# Patient Record
Sex: Female | Born: 1980 | Race: Black or African American | Hispanic: No | Marital: Single | Smoking: Former smoker
Health system: Southern US, Community
[De-identification: ages and names within clinical notes are randomized; demographics above are authoritative.]

## PROBLEM LIST (undated history)

## (undated) ENCOUNTER — Inpatient Hospital Stay (HOSPITAL_COMMUNITY): Payer: Self-pay

## (undated) DIAGNOSIS — Z789 Other specified health status: Secondary | ICD-10-CM

## (undated) HISTORY — PX: CHOLECYSTECTOMY: SHX55

## (undated) HISTORY — PX: WISDOM TOOTH EXTRACTION: SHX21

---

## 1998-04-07 ENCOUNTER — Emergency Department (HOSPITAL_COMMUNITY): Admission: EM | Admit: 1998-04-07 | Discharge: 1998-04-07 | Payer: Self-pay

## 1998-06-07 ENCOUNTER — Other Ambulatory Visit: Admission: RE | Admit: 1998-06-07 | Discharge: 1998-06-07 | Payer: Self-pay | Admitting: Obstetrics

## 1998-07-09 ENCOUNTER — Emergency Department (HOSPITAL_COMMUNITY): Admission: EM | Admit: 1998-07-09 | Discharge: 1998-07-09 | Payer: Self-pay | Admitting: Emergency Medicine

## 1998-08-21 ENCOUNTER — Inpatient Hospital Stay (HOSPITAL_COMMUNITY): Admission: AD | Admit: 1998-08-21 | Discharge: 1998-08-21 | Payer: Self-pay | Admitting: Obstetrics

## 1998-08-22 ENCOUNTER — Inpatient Hospital Stay (HOSPITAL_COMMUNITY): Admission: AD | Admit: 1998-08-22 | Discharge: 1998-08-22 | Payer: Self-pay | Admitting: Obstetrics

## 1998-08-29 ENCOUNTER — Ambulatory Visit (HOSPITAL_COMMUNITY): Admission: RE | Admit: 1998-08-29 | Discharge: 1998-08-29 | Payer: Self-pay | Admitting: Obstetrics

## 1998-09-15 ENCOUNTER — Inpatient Hospital Stay (HOSPITAL_COMMUNITY): Admission: AD | Admit: 1998-09-15 | Discharge: 1998-09-15 | Payer: Self-pay | Admitting: Obstetrics

## 1998-11-08 ENCOUNTER — Encounter: Payer: Self-pay | Admitting: Emergency Medicine

## 1998-11-08 ENCOUNTER — Emergency Department (HOSPITAL_COMMUNITY): Admission: EM | Admit: 1998-11-08 | Discharge: 1998-11-08 | Payer: Self-pay | Admitting: Emergency Medicine

## 1998-12-11 ENCOUNTER — Emergency Department (HOSPITAL_COMMUNITY): Admission: EM | Admit: 1998-12-11 | Discharge: 1998-12-11 | Payer: Self-pay | Admitting: Emergency Medicine

## 1999-02-21 ENCOUNTER — Emergency Department (HOSPITAL_COMMUNITY): Admission: EM | Admit: 1999-02-21 | Discharge: 1999-02-21 | Payer: Self-pay | Admitting: Emergency Medicine

## 1999-03-16 ENCOUNTER — Inpatient Hospital Stay (HOSPITAL_COMMUNITY): Admission: AD | Admit: 1999-03-16 | Discharge: 1999-03-16 | Payer: Self-pay | Admitting: Obstetrics

## 1999-07-08 ENCOUNTER — Inpatient Hospital Stay (HOSPITAL_COMMUNITY): Admission: AD | Admit: 1999-07-08 | Discharge: 1999-07-08 | Payer: Self-pay | Admitting: Obstetrics

## 1999-07-10 ENCOUNTER — Inpatient Hospital Stay (HOSPITAL_COMMUNITY): Admission: AD | Admit: 1999-07-10 | Discharge: 1999-07-10 | Payer: Self-pay | Admitting: Obstetrics

## 1999-07-22 ENCOUNTER — Inpatient Hospital Stay (HOSPITAL_COMMUNITY): Admission: AD | Admit: 1999-07-22 | Discharge: 1999-07-22 | Payer: Self-pay | Admitting: Obstetrics

## 1999-08-04 ENCOUNTER — Inpatient Hospital Stay (HOSPITAL_COMMUNITY): Admission: AD | Admit: 1999-08-04 | Discharge: 1999-08-04 | Payer: Self-pay | Admitting: Obstetrics

## 1999-08-31 ENCOUNTER — Inpatient Hospital Stay (HOSPITAL_COMMUNITY): Admission: AD | Admit: 1999-08-31 | Discharge: 1999-08-31 | Payer: Self-pay | Admitting: Obstetrics

## 1999-09-15 ENCOUNTER — Inpatient Hospital Stay (HOSPITAL_COMMUNITY): Admission: AD | Admit: 1999-09-15 | Discharge: 1999-09-15 | Payer: Self-pay | Admitting: Obstetrics

## 1999-09-20 ENCOUNTER — Inpatient Hospital Stay (HOSPITAL_COMMUNITY): Admission: AD | Admit: 1999-09-20 | Discharge: 1999-09-20 | Payer: Self-pay | Admitting: Obstetrics

## 1999-09-30 ENCOUNTER — Inpatient Hospital Stay (HOSPITAL_COMMUNITY): Admission: AD | Admit: 1999-09-30 | Discharge: 1999-09-30 | Payer: Self-pay | Admitting: Obstetrics

## 1999-10-01 ENCOUNTER — Inpatient Hospital Stay (HOSPITAL_COMMUNITY): Admission: AD | Admit: 1999-10-01 | Discharge: 1999-10-01 | Payer: Self-pay | Admitting: Obstetrics

## 1999-10-20 ENCOUNTER — Inpatient Hospital Stay (HOSPITAL_COMMUNITY): Admission: AD | Admit: 1999-10-20 | Discharge: 1999-10-20 | Payer: Self-pay | Admitting: Internal Medicine

## 1999-10-20 ENCOUNTER — Inpatient Hospital Stay (HOSPITAL_COMMUNITY): Admission: AD | Admit: 1999-10-20 | Discharge: 1999-10-20 | Payer: Self-pay | Admitting: Obstetrics

## 1999-10-21 ENCOUNTER — Inpatient Hospital Stay (HOSPITAL_COMMUNITY): Admission: AD | Admit: 1999-10-21 | Discharge: 1999-10-23 | Payer: Self-pay | Admitting: Obstetrics

## 2000-06-27 ENCOUNTER — Other Ambulatory Visit: Admission: RE | Admit: 2000-06-27 | Discharge: 2000-06-27 | Payer: Self-pay | Admitting: Obstetrics

## 2000-08-02 ENCOUNTER — Emergency Department (HOSPITAL_COMMUNITY): Admission: EM | Admit: 2000-08-02 | Discharge: 2000-08-02 | Payer: Self-pay | Admitting: Emergency Medicine

## 2000-09-11 ENCOUNTER — Inpatient Hospital Stay (HOSPITAL_COMMUNITY): Admission: AD | Admit: 2000-09-11 | Discharge: 2000-09-11 | Payer: Self-pay | Admitting: Obstetrics

## 2000-09-15 ENCOUNTER — Inpatient Hospital Stay (HOSPITAL_COMMUNITY): Admission: AD | Admit: 2000-09-15 | Discharge: 2000-09-15 | Payer: Self-pay | Admitting: Obstetrics

## 2000-09-27 ENCOUNTER — Inpatient Hospital Stay (HOSPITAL_COMMUNITY): Admission: AD | Admit: 2000-09-27 | Discharge: 2000-09-27 | Payer: Self-pay | Admitting: Obstetrics

## 2000-10-01 ENCOUNTER — Inpatient Hospital Stay (HOSPITAL_COMMUNITY): Admission: AD | Admit: 2000-10-01 | Discharge: 2000-10-01 | Payer: Self-pay | Admitting: Obstetrics

## 2000-10-09 ENCOUNTER — Inpatient Hospital Stay (HOSPITAL_COMMUNITY): Admission: AD | Admit: 2000-10-09 | Discharge: 2000-10-09 | Payer: Self-pay | Admitting: Obstetrics

## 2000-10-11 ENCOUNTER — Inpatient Hospital Stay (HOSPITAL_COMMUNITY): Admission: AD | Admit: 2000-10-11 | Discharge: 2000-10-14 | Payer: Self-pay | Admitting: Obstetrics

## 2002-11-30 ENCOUNTER — Emergency Department (HOSPITAL_COMMUNITY): Admission: EM | Admit: 2002-11-30 | Discharge: 2002-11-30 | Payer: Self-pay | Admitting: Emergency Medicine

## 2003-04-13 ENCOUNTER — Emergency Department (HOSPITAL_COMMUNITY): Admission: EM | Admit: 2003-04-13 | Discharge: 2003-04-13 | Payer: Self-pay | Admitting: Emergency Medicine

## 2004-03-03 ENCOUNTER — Emergency Department (HOSPITAL_COMMUNITY): Admission: EM | Admit: 2004-03-03 | Discharge: 2004-03-03 | Payer: Self-pay | Admitting: Emergency Medicine

## 2005-01-25 ENCOUNTER — Emergency Department (HOSPITAL_COMMUNITY): Admission: EM | Admit: 2005-01-25 | Discharge: 2005-01-25 | Payer: Self-pay | Admitting: Emergency Medicine

## 2005-02-20 ENCOUNTER — Inpatient Hospital Stay (HOSPITAL_COMMUNITY): Admission: AD | Admit: 2005-02-20 | Discharge: 2005-02-20 | Payer: Self-pay | Admitting: *Deleted

## 2005-03-23 ENCOUNTER — Inpatient Hospital Stay (HOSPITAL_COMMUNITY): Admission: AD | Admit: 2005-03-23 | Discharge: 2005-03-23 | Payer: Self-pay | Admitting: *Deleted

## 2006-05-08 ENCOUNTER — Inpatient Hospital Stay (HOSPITAL_COMMUNITY): Admission: AD | Admit: 2006-05-08 | Discharge: 2006-05-08 | Payer: Self-pay | Admitting: Obstetrics

## 2006-05-25 ENCOUNTER — Emergency Department (HOSPITAL_COMMUNITY): Admission: EM | Admit: 2006-05-25 | Discharge: 2006-05-25 | Payer: Self-pay | Admitting: Emergency Medicine

## 2006-11-23 ENCOUNTER — Emergency Department (HOSPITAL_COMMUNITY): Admission: EM | Admit: 2006-11-23 | Discharge: 2006-11-23 | Payer: Self-pay | Admitting: Emergency Medicine

## 2007-04-09 ENCOUNTER — Emergency Department (HOSPITAL_COMMUNITY): Admission: EM | Admit: 2007-04-09 | Discharge: 2007-04-09 | Payer: Self-pay | Admitting: Emergency Medicine

## 2007-11-08 ENCOUNTER — Emergency Department (HOSPITAL_COMMUNITY): Admission: EM | Admit: 2007-11-08 | Discharge: 2007-11-08 | Payer: Self-pay | Admitting: Emergency Medicine

## 2008-06-09 ENCOUNTER — Emergency Department (HOSPITAL_COMMUNITY): Admission: EM | Admit: 2008-06-09 | Discharge: 2008-06-09 | Payer: Self-pay | Admitting: Emergency Medicine

## 2008-08-18 ENCOUNTER — Emergency Department (HOSPITAL_COMMUNITY): Admission: EM | Admit: 2008-08-18 | Discharge: 2008-08-18 | Payer: Self-pay | Admitting: Emergency Medicine

## 2009-06-26 ENCOUNTER — Emergency Department (HOSPITAL_COMMUNITY): Admission: EM | Admit: 2009-06-26 | Discharge: 2009-06-26 | Payer: Self-pay | Admitting: Family Medicine

## 2009-07-27 ENCOUNTER — Inpatient Hospital Stay (HOSPITAL_COMMUNITY): Admission: AD | Admit: 2009-07-27 | Discharge: 2009-07-27 | Payer: Self-pay | Admitting: Obstetrics

## 2009-07-27 ENCOUNTER — Emergency Department (HOSPITAL_COMMUNITY): Admission: EM | Admit: 2009-07-27 | Discharge: 2009-07-28 | Payer: Self-pay | Admitting: Emergency Medicine

## 2009-11-05 ENCOUNTER — Emergency Department (HOSPITAL_COMMUNITY): Admission: EM | Admit: 2009-11-05 | Discharge: 2009-11-05 | Payer: Self-pay | Admitting: Family Medicine

## 2009-12-26 ENCOUNTER — Emergency Department (HOSPITAL_COMMUNITY): Admission: EM | Admit: 2009-12-26 | Discharge: 2009-12-26 | Payer: Self-pay | Admitting: Emergency Medicine

## 2009-12-28 ENCOUNTER — Emergency Department (HOSPITAL_COMMUNITY): Admission: EM | Admit: 2009-12-28 | Discharge: 2009-12-28 | Payer: Self-pay | Admitting: Emergency Medicine

## 2010-01-01 ENCOUNTER — Emergency Department (HOSPITAL_COMMUNITY): Admission: EM | Admit: 2010-01-01 | Discharge: 2010-01-01 | Payer: Self-pay | Admitting: Emergency Medicine

## 2010-01-05 ENCOUNTER — Emergency Department (HOSPITAL_COMMUNITY): Admission: EM | Admit: 2010-01-05 | Discharge: 2010-01-05 | Payer: Self-pay | Admitting: Emergency Medicine

## 2010-06-04 ENCOUNTER — Emergency Department (HOSPITAL_COMMUNITY): Admission: EM | Admit: 2010-06-04 | Discharge: 2010-06-04 | Payer: Self-pay | Admitting: Emergency Medicine

## 2010-06-07 ENCOUNTER — Emergency Department (HOSPITAL_COMMUNITY): Admission: EM | Admit: 2010-06-07 | Discharge: 2010-06-08 | Payer: Self-pay | Admitting: Emergency Medicine

## 2010-07-20 ENCOUNTER — Emergency Department (HOSPITAL_COMMUNITY)
Admission: EM | Admit: 2010-07-20 | Discharge: 2010-07-20 | Payer: Self-pay | Source: Home / Self Care | Admitting: Emergency Medicine

## 2010-08-23 ENCOUNTER — Encounter
Admission: RE | Admit: 2010-08-23 | Discharge: 2010-08-23 | Payer: Self-pay | Source: Home / Self Care | Attending: Gastroenterology | Admitting: Gastroenterology

## 2010-09-01 ENCOUNTER — Other Ambulatory Visit (HOSPITAL_COMMUNITY): Payer: Self-pay

## 2010-09-04 ENCOUNTER — Encounter (HOSPITAL_COMMUNITY): Payer: Medicaid Other | Attending: General Surgery

## 2010-09-04 LAB — SURGICAL PCR SCREEN: Staphylococcus aureus: NEGATIVE

## 2010-09-05 ENCOUNTER — Ambulatory Visit (HOSPITAL_COMMUNITY)
Admission: EM | Admit: 2010-09-05 | Discharge: 2010-09-05 | Disposition: A | Discharge status: Home / Self Care | Payer: Medicaid Other | Attending: Emergency Medicine | Admitting: Emergency Medicine

## 2010-09-05 DIAGNOSIS — E669 Obesity, unspecified: Secondary | ICD-10-CM | POA: Insufficient documentation

## 2010-09-05 DIAGNOSIS — K8010 Calculus of gallbladder with chronic cholecystitis without obstruction: Secondary | ICD-10-CM | POA: Insufficient documentation

## 2010-09-05 DIAGNOSIS — R1011 Right upper quadrant pain: Secondary | ICD-10-CM | POA: Insufficient documentation

## 2010-09-05 DIAGNOSIS — R1013 Epigastric pain: Secondary | ICD-10-CM | POA: Insufficient documentation

## 2010-09-05 DIAGNOSIS — Z01812 Encounter for preprocedural laboratory examination: Secondary | ICD-10-CM | POA: Insufficient documentation

## 2010-09-05 LAB — LIPASE, BLOOD: Lipase: 20 U/L (ref 11–59)

## 2010-09-05 LAB — COMPREHENSIVE METABOLIC PANEL
AST: 17 U/L (ref 0–37)
Alkaline Phosphatase: 83 U/L (ref 39–117)
Calcium: 9.3 mg/dL (ref 8.4–10.5)
Chloride: 105 mEq/L (ref 96–112)
Creatinine, Ser: 0.9 mg/dL (ref 0.4–1.2)
GFR calc Af Amer: >60 mL/min (ref >60)
GFR calc non Af Amer: >60 mL/min (ref >60)
Sodium: 139 mEq/L (ref 135–145)
Total Protein: 7.1 g/dL (ref 6.0–8.3)

## 2010-09-05 LAB — DIFFERENTIAL
Basophils Relative: 0 % (ref 0–1)
Lymphocytes Relative: 43 % (ref 12–46)
Lymphs Abs: 4.3 10*3/uL — ABNORMAL HIGH (ref 0.7–4.0)
Monocytes Absolute: 0.9 10*3/uL (ref 0.1–1.0)
Monocytes Relative: 9 % (ref 3–12)
Neutrophils Relative %: 43 % (ref 43–77)

## 2010-09-05 LAB — CBC
MCHC: 32.4 g/dL (ref 30.0–36.0)
RBC: 4.54 MIL/uL (ref 3.87–5.11)
RDW: 14.5 % (ref 11.5–15.5)

## 2010-09-07 ENCOUNTER — Other Ambulatory Visit: Payer: Self-pay | Admitting: General Surgery

## 2010-09-07 ENCOUNTER — Ambulatory Visit (HOSPITAL_COMMUNITY): Payer: Medicaid Other

## 2010-09-07 ENCOUNTER — Ambulatory Visit (HOSPITAL_COMMUNITY)
Admission: RE | Admit: 2010-09-07 | Discharge: 2010-09-07 | Disposition: A | Discharge status: Home / Self Care | Payer: Medicaid Other | Source: Ambulatory Visit | Attending: General Surgery | Admitting: General Surgery

## 2010-09-07 DIAGNOSIS — K8010 Calculus of gallbladder with chronic cholecystitis without obstruction: Secondary | ICD-10-CM | POA: Insufficient documentation

## 2010-09-07 DIAGNOSIS — E669 Obesity, unspecified: Secondary | ICD-10-CM | POA: Insufficient documentation

## 2010-09-07 DIAGNOSIS — F17200 Nicotine dependence, unspecified, uncomplicated: Secondary | ICD-10-CM | POA: Insufficient documentation

## 2010-09-21 NOTE — Op Note (Signed)
Christine Ferguson, Christine Ferguson              ACCOUNT NO.:  [id number]  MEDICAL RECORD NO.:  [id number]           PATIENT TYPE:  E  LOCATION:  WLED                         FACILITY:  [facility]  PHYSICIAN:  Anselm Pancoast. Makar Slatter, M.D.DATE OF BIRTH:  11/02/1980  DATE OF PROCEDURE:  09/07/2010 DATE OF DISCHARGE:  09/05/2010                              OPERATIVE REPORT   PREOPERATIVE DIAGNOSIS:  Chronic cholecystitis with stones and obesity.  OPERATION:  Laparoscopic cholecystectomy with cholangiogram.  SURGEON:  Anselm Pancoast. Zachery Dakins, M.D.  ASSISTANT:  Clovis Pu. Cornett, M.D.  ANESTHESIA:  General.  HISTORY:  Madiha Bambrick is a 30 year old moderately overweight female who was referred from the emergency room to our office for symptomatic gallstones.  She has had repeated episodes of epigastric pain.  Dr. Ronne Binning is her regular physician.  She was also seen by Charlott Rakes and she showed many, many stones within an ultrasound of her gallbladder.  The CBC and CMET were unremarkable and her liver function studies have never been abnormal.  She is 5 feet 6, weighs 316 pounds and has had three pregnancies, the youngest is about 32 or 32 years old. She was scheduled for surgery and then since I have seen her in the office of only about a week ago, she has had another episode of pain and went to the emergency room on Monday night and was treated with pain medication.  She is presently not working and is here for the planned procedure.  PROCEDURE IN DETAIL:  She was given 3 grams of Unasyn.  She has PAS stockings.  Positioned on the OR table.  Induction of general anesthesia.  Endotracheal tube was placed.  The abdomen was prepped with Betadine solution and draped in the sterile manner.  After the time-out was completed, the patient had a little small incision made above the umbilicus since she is so heavy and the fascia identified with use of appendiceal retractors on a small vertical opening.   The underlying peritoneum I could feel with my finger and a little opening was made.  A pursestring suture of 0 Vicryl was placed and then the Hasson cannula was introduced.  The gallbladder is visible, but not acutely inflamed. The upper 10-mm trocar was placed in the immediate subxiphoid area two lateral 5-mm trocars were placed by Dr. Luisa Hart after anesthetizing the fascia.  The gallbladder was retracted upward and you could see the numerous stones within the gallbladder, one sort of impacted in the neck of the gallbladder and the cystic duct.  I identified the cystic artery and encompassed this with a right-angle and then put two clips on it proximally, singly, distally and divided and then that gave me a good view of the actual cystic duct.  That was also encircled with the right- angle and then the stone that was in the cystic duct gallbladder junction was kind of pushed up into the gallbladder.  I then placed a clip on the distal cystic duct.  A little small opening was made just proximal.  Milking the cystic duct, bile comes out and then a Cook catheter was introduced and held in place  with a clip.  The x-ray shows a kind of about a 2 cm cystic duct remaining, a long common hepatic and common bile duct and good flow into the duodenum.  The catheter was removed.  Three clips were placed across the proximal cystic duct and then it was divided.  I could identify what I think is a posterior branch of the cystic artery and this was doubly clipped proximally.  I did not put a clip distally.  I divided it and then the gallbladder was freed from its bed with the hook electrocautery.  The gallbladder, after it was freed, was placed in an EndoCatch bag.  There was no significant bile spillage, just from the little cystic duct area, and then I used a spatula to cauterize a few little areas of the liver bed for good hemostasis.  The camera was then switched to the upper 10-mm port and then  the bag containing the gallbladder grasped, brought up and then from kind of massaging it, it was able to be brought through the fascia without enlarging it.  I put a figure-of-eight of 0 Vicryl with a T5 needle for a little more fascia closure, tied both sutures and then anesthetized the fascia at the umbilicus.  The subcutaneous wounds were closed with 4-0 Vicryl.  The 5-mm ports had been withdrawn under direct vision.  The little bit of irrigating fluid that we had used was aspirated prior to removal of the upper 10-mm trocar.  I used the OptiSeal that we were using for a trial, a kind of a superglue adhesive for the skin and it appears to work nicely.  The patient tolerated the procedure nicely.  She should be able to be released after a short stay in the recovery room and I will see her back in the office in approximately 2 weeks.     Anselm Pancoast. Zachery Dakins, M.D.     WJW/MEDQ  D:  09/07/2010  T:  09/07/2010  Job:  [id number]  cc:   Anselm Pancoast. Zachery Dakins, M.D. [address] [place] [address]  Shirley Friar, MD Fax: [phone]  Electronically Signed by Consuello Bossier M.D. on 09/21/2010 09:42:38 AM

## 2010-10-09 LAB — COMPREHENSIVE METABOLIC PANEL
Albumin: 3.6 g/dL (ref 3.5–5.2)
Alkaline Phosphatase: 76 U/L (ref 39–117)
Glucose, Bld: 96 mg/dL (ref 70–99)
Potassium: 4.3 mEq/L (ref 3.5–5.1)
Sodium: 139 mEq/L (ref 135–145)
Total Protein: 7.2 g/dL (ref 6.0–8.3)

## 2010-10-09 LAB — DIFFERENTIAL
Basophils Absolute: 0 10*3/uL (ref 0.0–0.1)
Basophils Relative: 0 % (ref 0–1)
Eosinophils Relative: 4 % (ref 0–5)
Lymphs Abs: 4.2 10*3/uL — ABNORMAL HIGH (ref 0.7–4.0)

## 2010-10-09 LAB — CBC
MCH: 26.5 pg (ref 26.0–34.0)
MCHC: 32.6 g/dL (ref 30.0–36.0)
MCV: 81.2 fL (ref 78.0–100.0)
Platelets: 319 10*3/uL (ref 150–400)
RDW: 15 % (ref 11.5–15.5)

## 2010-10-09 LAB — POCT PREGNANCY, URINE: Preg Test, Ur: NEGATIVE

## 2010-10-09 LAB — URINALYSIS, ROUTINE W REFLEX MICROSCOPIC
Bilirubin Urine: NEGATIVE
Glucose, UA: NEGATIVE mg/dL
Hgb urine dipstick: NEGATIVE
Urobilinogen, UA: 0.2 mg/dL (ref 0.0–1.0)

## 2010-10-09 LAB — LIPASE, BLOOD: Lipase: 50 U/L (ref 11–59)

## 2010-10-16 LAB — URINE CULTURE
Colony Count: NO GROWTH
Culture: NO GROWTH

## 2010-10-16 LAB — CBC
HCT: 34.5 % — ABNORMAL LOW (ref 36.0–46.0)
Hemoglobin: 11.5 g/dL — ABNORMAL LOW (ref 12.0–15.0)
MCHC: 33.2 g/dL (ref 30.0–36.0)
MCV: 82.4 fL (ref 78.0–100.0)
RBC: 4.19 MIL/uL (ref 3.87–5.11)
RDW: 14.9 % (ref 11.5–15.5)

## 2010-10-16 LAB — COMPREHENSIVE METABOLIC PANEL
Alkaline Phosphatase: 96 U/L (ref 39–117)
BUN: 12 mg/dL (ref 6–23)
Chloride: 105 mEq/L (ref 96–112)
Creatinine, Ser: 1.06 mg/dL (ref 0.4–1.2)
Glucose, Bld: 108 mg/dL — ABNORMAL HIGH (ref 70–99)
Potassium: 3.7 mEq/L (ref 3.5–5.1)
Total Bilirubin: 0.6 mg/dL (ref 0.3–1.2)
Total Protein: 7.6 g/dL (ref 6.0–8.3)

## 2010-10-16 LAB — URINALYSIS, ROUTINE W REFLEX MICROSCOPIC
Ketones, ur: 15 mg/dL — AB
Ketones, ur: 40 mg/dL — AB
Nitrite: POSITIVE — AB
Nitrite: POSITIVE — AB
Protein, ur: >300 mg/dL — AB
Protein, ur: 100 mg/dL — AB
Specific Gravity, Urine: 1.012 (ref 1.005–1.030)
Urobilinogen, UA: 2 mg/dL — ABNORMAL HIGH (ref 0.0–1.0)
Urobilinogen, UA: 2 mg/dL — ABNORMAL HIGH (ref 0.0–1.0)

## 2010-10-16 LAB — POCT I-STAT, CHEM 8
Calcium, Ion: 1.12 mmol/L (ref 1.12–1.32)
Glucose, Bld: 108 mg/dL — ABNORMAL HIGH (ref 70–99)
HCT: 37 % (ref 36.0–46.0)
Hemoglobin: 12.6 g/dL (ref 12.0–15.0)

## 2010-10-16 LAB — GC/CHLAMYDIA PROBE AMP, GENITAL: GC Probe Amp, Genital: NEGATIVE

## 2010-10-16 LAB — URINE MICROSCOPIC-ADD ON

## 2010-10-16 LAB — DIFFERENTIAL
Basophils Relative: 0 % (ref 0–1)
Eosinophils Relative: 3 % (ref 0–5)
Monocytes Absolute: 0.6 10*3/uL (ref 0.1–1.0)
Monocytes Relative: 8 % (ref 3–12)
Neutro Abs: 4.6 10*3/uL (ref 1.7–7.7)

## 2010-10-16 LAB — POCT PREGNANCY, URINE: Preg Test, Ur: NEGATIVE

## 2010-10-16 LAB — WET PREP, GENITAL: Yeast Wet Prep HPF POC: NONE SEEN

## 2010-10-16 LAB — PROTIME-INR
INR: 0.99 (ref 0.00–1.49)
Prothrombin Time: 13 seconds (ref 11.6–15.2)

## 2010-10-16 LAB — LIPASE, BLOOD: Lipase: 20 U/L (ref 11–59)

## 2010-10-18 LAB — CULTURE, ROUTINE-ABSCESS

## 2010-10-30 LAB — URINALYSIS, ROUTINE W REFLEX MICROSCOPIC
Bilirubin Urine: NEGATIVE
Glucose, UA: NEGATIVE mg/dL
Nitrite: NEGATIVE
Protein, ur: 30 mg/dL — AB
Specific Gravity, Urine: 1.033 — ABNORMAL HIGH (ref 1.005–1.030)
Urobilinogen, UA: 1 mg/dL (ref 0.0–1.0)
pH: 6 (ref 5.0–8.0)

## 2010-10-30 LAB — URINE MICROSCOPIC-ADD ON

## 2010-10-30 LAB — WET PREP, GENITAL
Clue Cells Wet Prep HPF POC: NONE SEEN
Yeast Wet Prep HPF POC: NONE SEEN

## 2010-10-30 LAB — GC/CHLAMYDIA PROBE AMP, GENITAL
Chlamydia, DNA Probe: NEGATIVE
GC Probe Amp, Genital: NEGATIVE

## 2010-10-30 LAB — URINE CULTURE
Colony Count: NO GROWTH
Culture: NO GROWTH

## 2010-10-30 LAB — PREGNANCY, URINE: Preg Test, Ur: NEGATIVE

## 2010-11-01 LAB — URINE MICROSCOPIC-ADD ON

## 2010-11-01 LAB — URINALYSIS, ROUTINE W REFLEX MICROSCOPIC
Nitrite: POSITIVE — AB
Specific Gravity, Urine: 1.027 (ref 1.005–1.030)
Urobilinogen, UA: 2 mg/dL — ABNORMAL HIGH (ref 0.0–1.0)
pH: 5 (ref 5.0–8.0)

## 2010-11-01 LAB — POCT PREGNANCY, URINE: Preg Test, Ur: NEGATIVE

## 2010-11-01 LAB — URINE CULTURE: Colony Count: 9000

## 2010-11-13 LAB — POCT URINALYSIS DIP (DEVICE)
Protein, ur: 100 mg/dL — AB
Specific Gravity, Urine: 1.015 (ref 1.005–1.030)
pH: 8.5 — ABNORMAL HIGH (ref 5.0–8.0)

## 2010-11-13 LAB — URINE CULTURE: Colony Count: 80000

## 2011-01-30 ENCOUNTER — Emergency Department (HOSPITAL_COMMUNITY)
Admission: EM | Admit: 2011-01-30 | Discharge: 2011-01-31 | Disposition: A | Discharge status: Home / Self Care | Payer: Medicaid Other | Attending: Emergency Medicine | Admitting: Emergency Medicine

## 2011-01-30 DIAGNOSIS — L03119 Cellulitis of unspecified part of limb: Secondary | ICD-10-CM | POA: Insufficient documentation

## 2011-01-30 DIAGNOSIS — L02419 Cutaneous abscess of limb, unspecified: Secondary | ICD-10-CM | POA: Insufficient documentation

## 2011-04-09 ENCOUNTER — Emergency Department (HOSPITAL_COMMUNITY)
Admission: EM | Admit: 2011-04-09 | Discharge: 2011-04-09 | Disposition: A | Discharge status: Home / Self Care | Payer: Medicaid Other | Attending: Emergency Medicine | Admitting: Emergency Medicine

## 2011-04-09 ENCOUNTER — Emergency Department (HOSPITAL_COMMUNITY): Payer: Medicaid Other

## 2011-04-09 DIAGNOSIS — M25569 Pain in unspecified knee: Secondary | ICD-10-CM | POA: Insufficient documentation

## 2012-01-02 ENCOUNTER — Emergency Department (HOSPITAL_COMMUNITY): Payer: Self-pay

## 2012-01-02 ENCOUNTER — Emergency Department (HOSPITAL_COMMUNITY)
Admission: EM | Admit: 2012-01-02 | Discharge: 2012-01-02 | Disposition: A | Discharge status: Home / Self Care | Payer: Self-pay | Attending: Emergency Medicine | Admitting: Emergency Medicine

## 2012-01-02 ENCOUNTER — Encounter (HOSPITAL_COMMUNITY): Payer: Self-pay | Admitting: Emergency Medicine

## 2012-01-02 DIAGNOSIS — M25519 Pain in unspecified shoulder: Secondary | ICD-10-CM | POA: Insufficient documentation

## 2012-01-02 DIAGNOSIS — S43016A Anterior dislocation of unspecified humerus, initial encounter: Secondary | ICD-10-CM | POA: Insufficient documentation

## 2012-01-02 DIAGNOSIS — S43005A Unspecified dislocation of left shoulder joint, initial encounter: Secondary | ICD-10-CM

## 2012-01-02 MED ORDER — HYDROMORPHONE HCL PF 1 MG/ML IJ SOLN
1.0000 mg | Freq: Once | INTRAMUSCULAR | Status: AC
  Administered 2012-01-02: 1 mg via INTRAMUSCULAR

## 2012-01-02 MED ORDER — HYDROMORPHONE HCL PF 1 MG/ML IJ SOLN
INTRAMUSCULAR | Status: AC
  Filled 2012-01-02: qty 1

## 2012-01-02 MED ORDER — TRAMADOL HCL 50 MG PO TABS: 50.0000 mg | ORAL_TABLET | Freq: Four times a day (QID) | ORAL | Status: AC | PRN

## 2012-01-02 MED ORDER — PROPOFOL 10 MG/ML IV BOLUS
INTRAVENOUS | Status: AC | PRN
  Administered 2012-01-02: 50 mg via INTRAVENOUS

## 2012-01-02 MED ORDER — FENTANYL CITRATE 0.05 MG/ML IJ SOLN
200.0000 ug | Freq: Once | INTRAMUSCULAR | Status: AC
  Administered 2012-01-02: 100 ug via INTRAVENOUS
  Filled 2012-01-02: qty 4

## 2012-01-02 MED ORDER — PROPOFOL 10 MG/ML IV BOLUS
1.0000 mg/kg | Freq: Once | INTRAVENOUS | Status: AC
  Administered 2012-01-02: 127 mg via INTRAVENOUS

## 2012-01-02 MED ORDER — NAPROXEN 500 MG PO TABS: 500.0000 mg | ORAL_TABLET | Freq: Two times a day (BID) | ORAL | Status: AC

## 2012-01-02 NOTE — Discharge Instructions (Signed)
Shoulder Dislocation  A dislocated shoulder means that the bones of the shoulder joint are out of place. The shoulder needs to be put back in place. It is held in place with a sling or shoulder immobilizer. It usually takes 4 to 6 weeks for the shoulder joint to heal completely. Follow up with your caregiver as directed. Do not use your arm until your caregiver approves. You may remove your sling or shoulder immobilizer to bathe, unless otherwise directed by your caregiver. Limit any movement of your arm while the sling or shoulder immobilizer is removed.  You are at an increased risk of additional dislocations, especially if you move your shoulder too much before it is healed. A shoulder that has been dislocated several times may require surgery to tighten up the joint in order to prevent dislocations in the future.  Nerves around the joint can also be damaged with this injury. This is not common. Watch for signs of nerve damage.   SEEK IMMEDIATE MEDICAL CARE IF:  You have numbness or weakness in the injured arm or hand.  Document Released: 07/16/2005 Document Revised: 07/05/2011 Document Reviewed: 12/26/2009  Exit[employer] Patient Information 2012 [employer].
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
## 2012-01-02 NOTE — ED Notes (Signed)
Patient given discharge instructions, information, prescriptions, and diet order. Patient states that they adequately understand discharge information given and to return to ED if symptoms return or worsen.     
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
## 2012-01-30 IMAGING — CR DG CHEST 2V
2 series · 2 of 2 positions shown · non-contrast
Comparison: None.

CLINICAL DATA: Cough and short of breath

CHEST - 2 VIEW

[w chest pa]
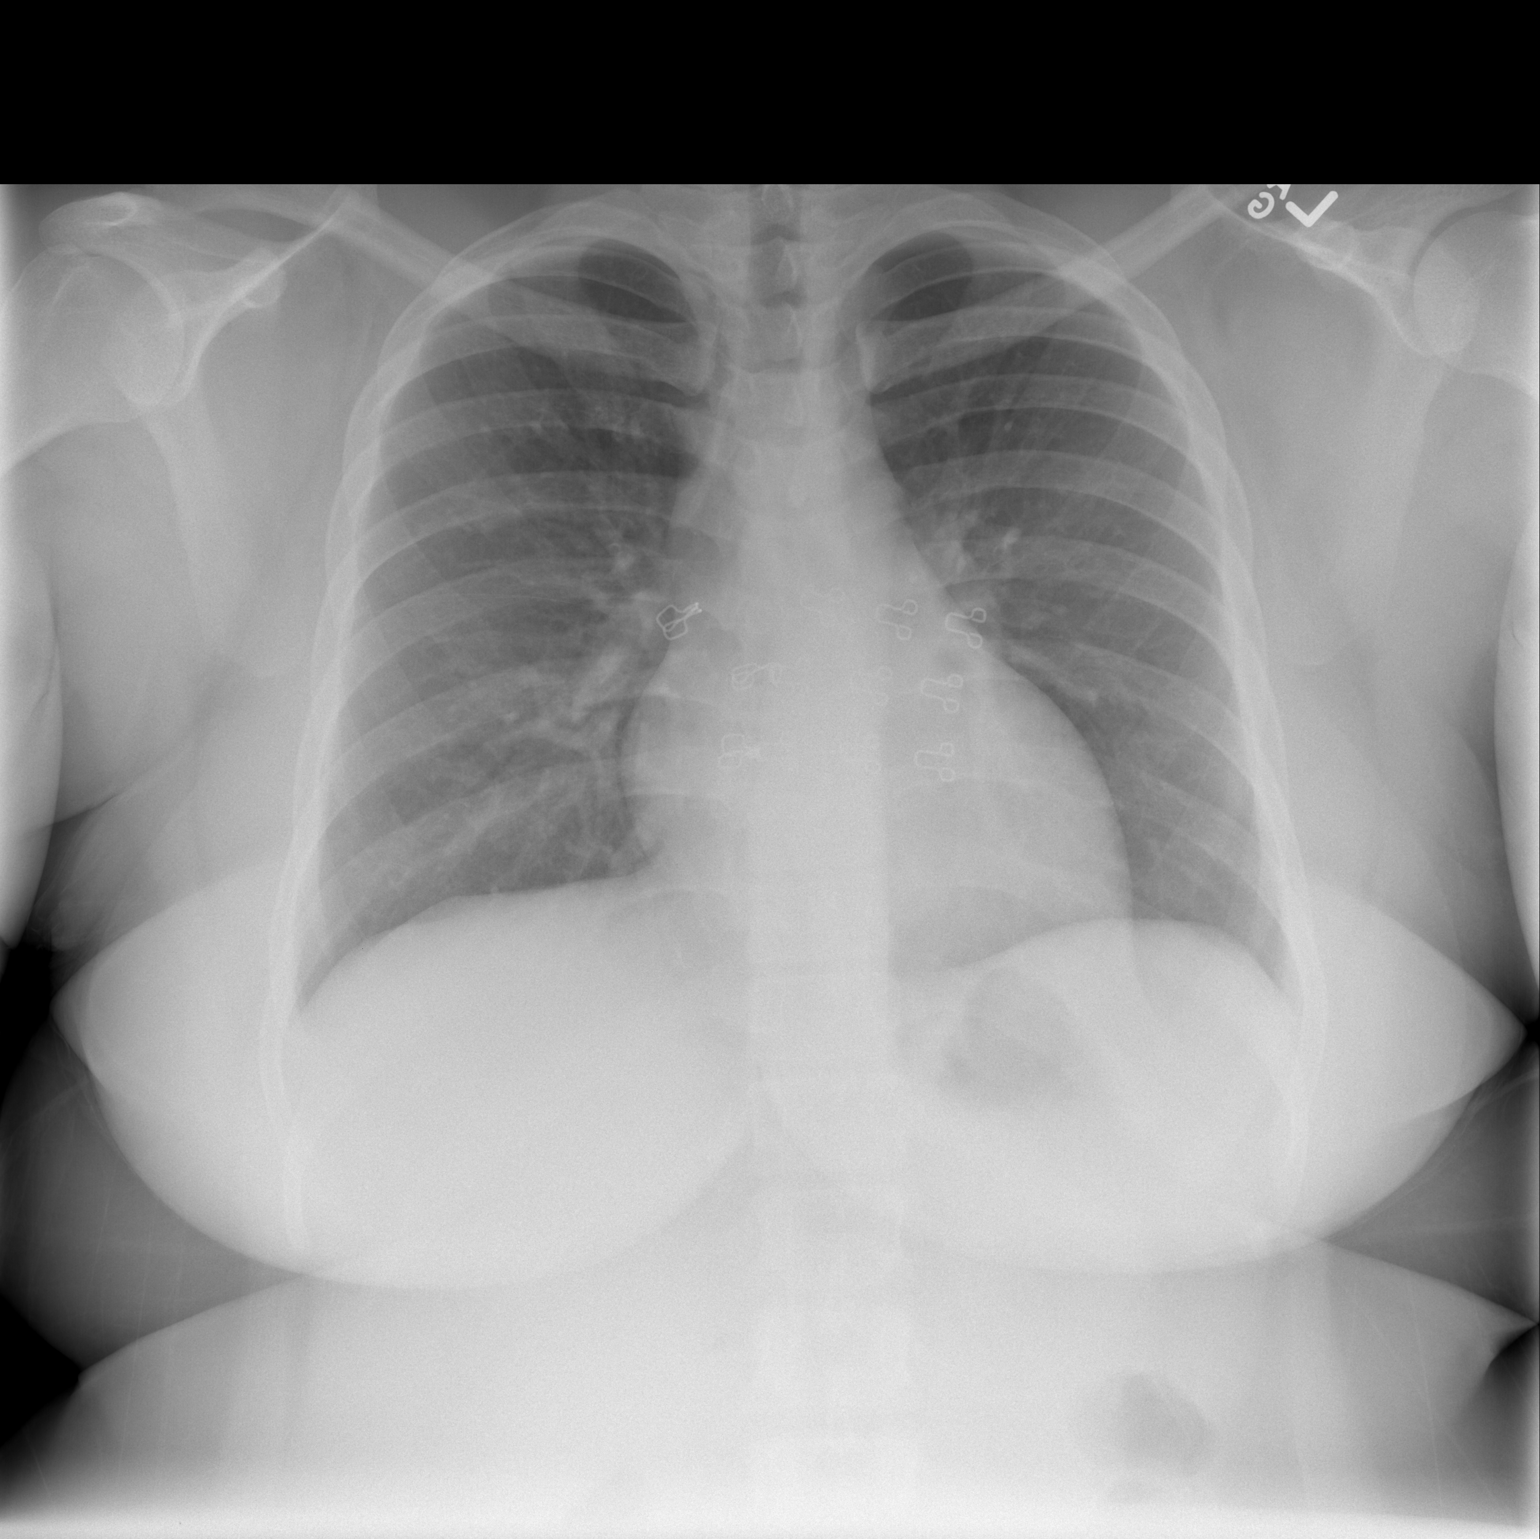

[w chest lat]
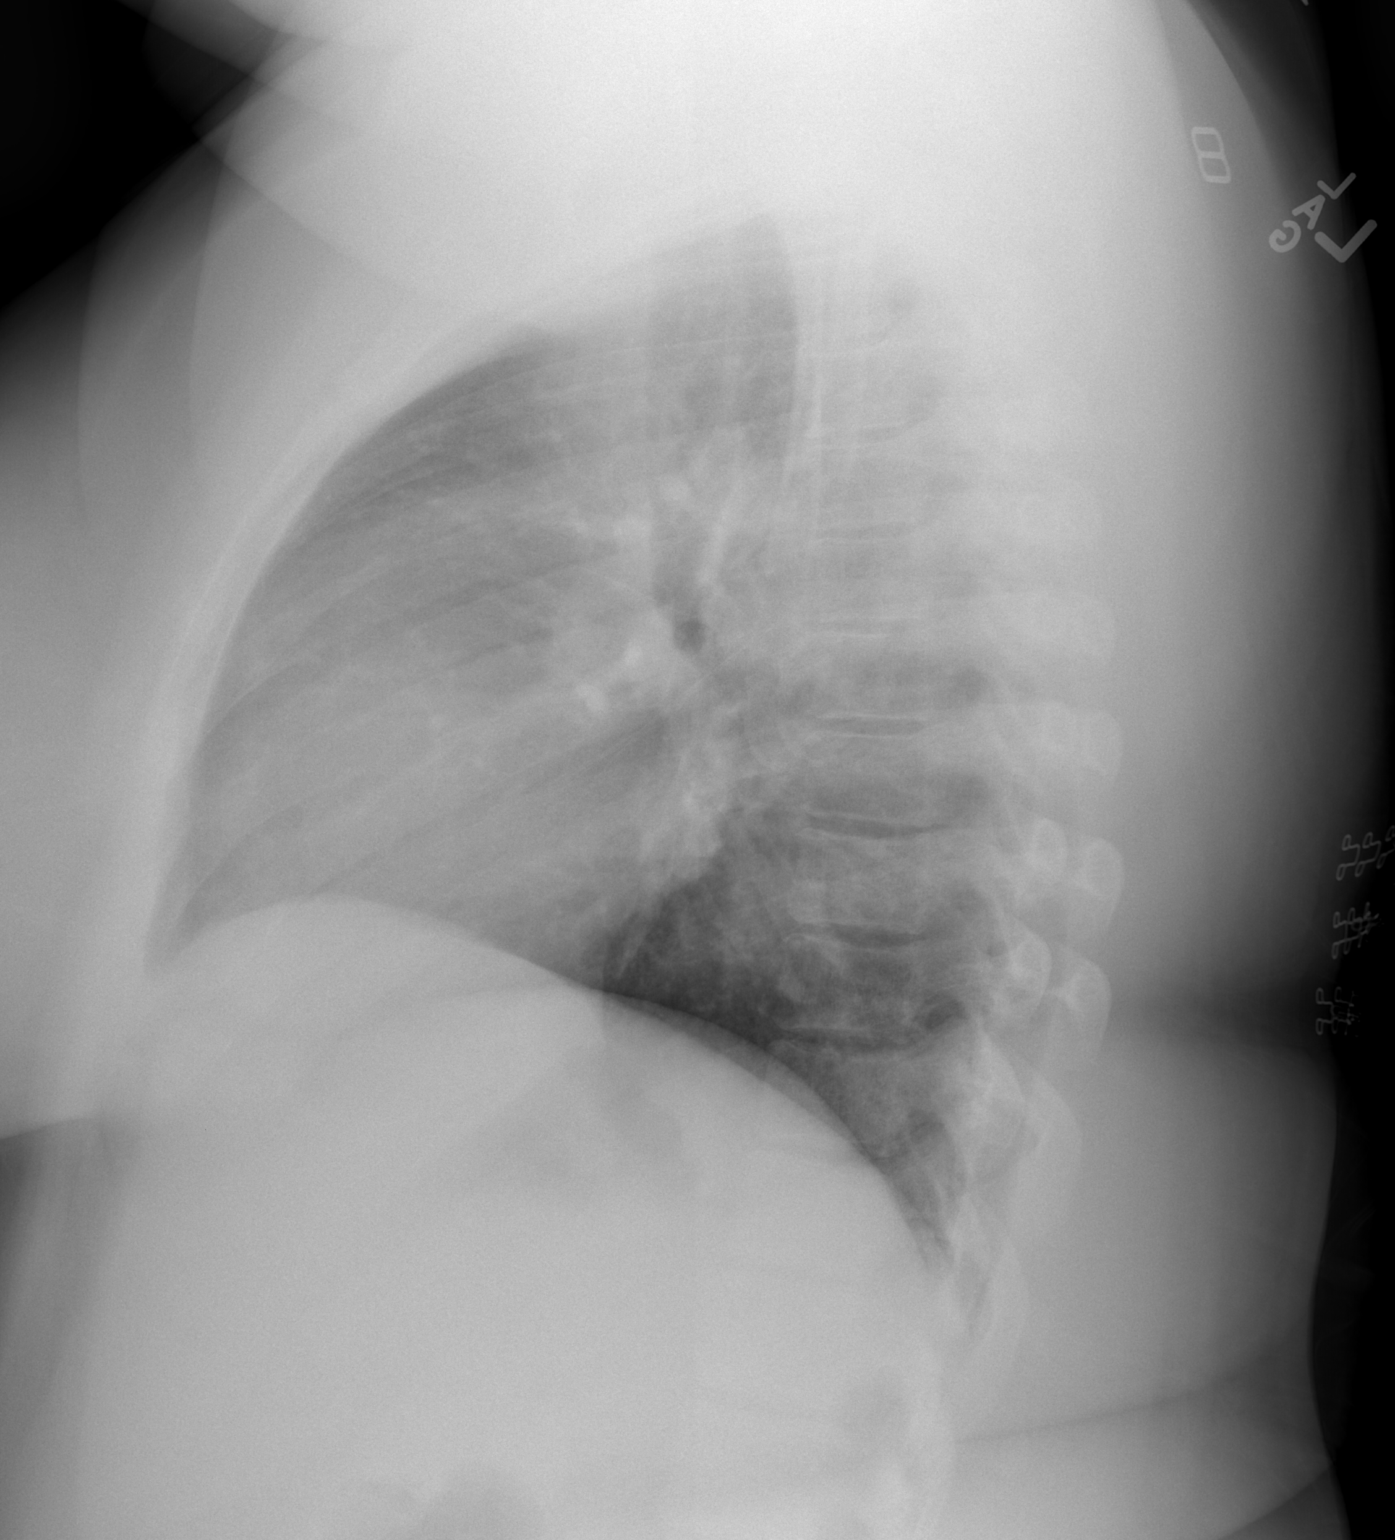

[2 of 2 positions shown; findings below may reference images not displayed]

FINDINGS: Heart is normal in size.  Lungs are clear.
IMPRESSION: No active cardiopulmonary disease.

## 2015-10-25 ENCOUNTER — Inpatient Hospital Stay (HOSPITAL_COMMUNITY)
Admission: AD | Admit: 2015-10-25 | Discharge: 2015-10-25 | Disposition: A | Discharge status: Home / Self Care | Payer: Medicaid Other | Source: Ambulatory Visit | Attending: Family Medicine | Admitting: Family Medicine

## 2015-10-25 ENCOUNTER — Encounter (HOSPITAL_COMMUNITY): Payer: Self-pay | Admitting: *Deleted

## 2015-10-25 DIAGNOSIS — Z87891 Personal history of nicotine dependence: Secondary | ICD-10-CM | POA: Insufficient documentation

## 2015-10-25 DIAGNOSIS — Z3A11 11 weeks gestation of pregnancy: Secondary | ICD-10-CM | POA: Diagnosis not present

## 2015-10-25 DIAGNOSIS — Z3201 Encounter for pregnancy test, result positive: Secondary | ICD-10-CM | POA: Diagnosis not present

## 2015-10-25 DIAGNOSIS — G43909 Migraine, unspecified, not intractable, without status migrainosus: Secondary | ICD-10-CM | POA: Diagnosis not present

## 2015-10-25 DIAGNOSIS — O26891 Other specified pregnancy related conditions, first trimester: Secondary | ICD-10-CM | POA: Diagnosis not present

## 2015-10-25 DIAGNOSIS — R519 Headache, unspecified: Secondary | ICD-10-CM

## 2015-10-25 DIAGNOSIS — R51 Headache: Secondary | ICD-10-CM | POA: Diagnosis not present

## 2015-10-25 HISTORY — DX: Other specified health status: Z78.9

## 2015-10-25 LAB — URINALYSIS, ROUTINE W REFLEX MICROSCOPIC
Bilirubin Urine: NEGATIVE
Glucose, UA: NEGATIVE mg/dL
HGB URINE DIPSTICK: NEGATIVE
Ketones, ur: NEGATIVE mg/dL
Leukocytes, UA: NEGATIVE
Nitrite: NEGATIVE
PH: 6.5 (ref 5.0–8.0)
Protein, ur: NEGATIVE mg/dL
SPECIFIC GRAVITY, URINE: 1.015 (ref 1.005–1.030)

## 2015-10-25 LAB — POCT PREGNANCY, URINE: PREG TEST UR: POSITIVE — AB

## 2015-10-25 MED ORDER — METOCLOPRAMIDE HCL 10 MG PO TABS
10.0000 mg | ORAL_TABLET | Freq: Once | ORAL | Status: AC
  Administered 2015-10-25: 10 mg via ORAL
  Filled 2015-10-25: qty 1

## 2015-10-25 NOTE — MAU Note (Signed)
Pt C/O HA intermittent since last week, became progressively worse since Sunday.  No hx of migraine HA's.  Denies visual changes but is sensitive to light.  Had pos UPT in [place] approx one month ago.

## 2015-10-25 NOTE — MAU Provider Note (Signed)
Chief Complaint: Headache   First Provider Initiated Contact with Patient 10/25/15 1658        SUBJECTIVE  HPI: Christine Ferguson is a 35 y.o. [id number] at 7775w5d by LMP who presents to maternity admissions reporting headache since last week. Took ibuprofen once without relief. She denies vaginal bleeding, vaginal itching/burning, urinary symptoms, h/a, dizziness, n/v, or fever/chills.   [facility] [place] [facility] [place], [place]  Headache  This is a new problem. The current episode started in the past 7 days. The problem occurs constantly. The problem has been unchanged. The pain is located in the bilateral region. The pain does not radiate. The quality of the pain is described as aching and dull. Associated symptoms include photophobia. Pertinent negatives include no abdominal pain, back pain, coughing, fever, muscle aches, nausea, neck pain, sinus pressure or visual change. The symptoms are aggravated by bright light. She has tried NSAIDs for the symptoms. The treatment provided no relief.   RN Note:  Expand All Collapse All   Pt C/O HA intermittent since last week, became progressively worse since Sunday. No hx of migraine HA's. Denies visual changes but is sensitive to light. Had pos UPT in [place] approx one month ago.          Past Medical History  Diagnosis Date  . Medical history non-contributory    Past Surgical History  Procedure Laterality Date  . Cholecystectomy    . Wisdom tooth extraction     Social History   Social History  . Marital Status: Single    Spouse Name: N/A  . Number of Children: N/A  . Years of Education: N/A   Occupational History  . Not on file.   Social History Main Topics  . Smoking status: Former Smoker -- 0.25 packs/day for 5 years    Types: Cigarettes  . Smokeless tobacco: Never Used  . Alcohol Use: No     Comment: socially  . Drug Use: No  . Sexual Activity: Yes   Other Topics Concern  . Not on file   Social  History Narrative   No current facility-administered medications on file prior to encounter.   No current outpatient prescriptions on file prior to encounter.   No Known Allergies  I have reviewed patient's Past Medical Hx, Surgical Hx, Family Hx, Social Hx, medications and allergies.   ROS:  Review of Systems  Constitutional: Negative for fever.  HENT: Negative for sinus pressure.   Eyes: Positive for photophobia.  Respiratory: Negative for cough.   Gastrointestinal: Negative for nausea and abdominal pain.  Musculoskeletal: Negative for back pain and neck pain.  Neurological: Positive for headaches.    Physical Exam  Patient Vitals for the past 24 hrs:  BP Temp Temp src Pulse Resp Height Weight  10/25/15 1656 - - - - - 5\' 6"  (1.676 m) 256 lb 9.6 oz (116.393 kg)  10/25/15 1648 110/71 mmHg 98.5 F (36.9 C) Oral 91 18 - -   Physical Exam  Constitutional: Well-developed, well-nourished female in no acute distress.  Cardiovascular: normal rate and rhythm Respiratory: normal effort, CTAB GI: Abd soft, non-tender. Pos BS x 4 MS: Extremities nontender, no edema, normal ROM Neurologic: Alert and oriented x 4.  GU: Neg CVAT. [id number]   LAB RESULTS Results for orders placed or performed during the hospital encounter of 10/25/15 (from the past 24 hour(s))  Pregnancy, urine POC     Status: Abnormal   Collection Time: 10/25/15  4:50 PM  Result Value Ref Range   Preg Test, Ur POSITIVE (A) NEGATIVE       IMAGING No results found.  MAU Management/MDM:  Treatments in MAU included Reglan for headache.   Got complete relief with Reglan.  Pt stable at time of discharge.  ASSESSMENT Intrauterine pregnancy at [redacted]w[redacted]d  Migraine headache  PLAN Discharge home Keep appt next week with [facility] doctor May need Rx for Fioricet if headaches become recurrent Will not prescribe anything for now, since this is her first headache    Medication List    Notice    You have not been  prescribed any medications.       Encouraged to return here or to other Urgent Care/ED if she develops worsening of symptoms, increase in pain, fever, or other concerning symptoms.    Wynelle Bourgeois CNM, MSN Certified Nurse-Midwife 10/25/2015  5:05 PM

## 2015-10-25 NOTE — Discharge Instructions (Signed)

## 2016-08-29 ENCOUNTER — Encounter (HOSPITAL_COMMUNITY): Payer: Self-pay

## 2021-05-05 ENCOUNTER — Encounter: Payer: Self-pay | Admitting: Family Medicine

## 2021-05-05 ENCOUNTER — Encounter: Payer: Self-pay | Admitting: General Practice
